# Patient Record
Sex: Male | Born: 1985 | Race: Black or African American | Hispanic: No | Marital: Married | Smoking: Current every day smoker
Health system: Southern US, Community
[De-identification: ages and names within clinical notes are randomized; demographics above are authoritative.]

---

## 2012-10-20 ENCOUNTER — Emergency Department: Payer: Self-pay | Admitting: Emergency Medicine

## 2013-05-22 ENCOUNTER — Emergency Department: Payer: Self-pay | Admitting: Emergency Medicine

## 2013-09-22 ENCOUNTER — Emergency Department: Payer: Self-pay | Admitting: Emergency Medicine

## 2014-01-14 ENCOUNTER — Emergency Department: Payer: Self-pay | Admitting: Emergency Medicine

## 2014-01-18 ENCOUNTER — Emergency Department: Payer: Self-pay | Admitting: Emergency Medicine

## 2014-07-14 ENCOUNTER — Emergency Department
Admission: EM | Admit: 2014-07-14 | Discharge: 2014-07-14 | Disposition: A | Discharge status: Home / Self Care | Payer: Self-pay | Attending: Emergency Medicine | Admitting: Emergency Medicine

## 2014-07-14 ENCOUNTER — Other Ambulatory Visit: Payer: Self-pay

## 2014-07-14 ENCOUNTER — Encounter: Payer: Self-pay | Admitting: Emergency Medicine

## 2014-07-14 DIAGNOSIS — F10129 Alcohol abuse with intoxication, unspecified: Secondary | ICD-10-CM | POA: Insufficient documentation

## 2014-07-14 DIAGNOSIS — Z720 Tobacco use: Secondary | ICD-10-CM | POA: Insufficient documentation

## 2014-07-14 DIAGNOSIS — F10220 Alcohol dependence with intoxication, uncomplicated: Secondary | ICD-10-CM

## 2014-07-14 DIAGNOSIS — R55 Syncope and collapse: Secondary | ICD-10-CM | POA: Insufficient documentation

## 2014-07-14 LAB — URINE DRUG SCREEN, QUALITATIVE (ARMC ONLY)
Amphetamines, Ur Screen: NOT DETECTED
Barbiturates, Ur Screen: NOT DETECTED
Benzodiazepine, Ur Scrn: NOT DETECTED
Cannabinoid 50 Ng, Ur ~~LOC~~: POSITIVE — AB
Cocaine Metabolite,Ur ~~LOC~~: NOT DETECTED
MDMA (Ecstasy)Ur Screen: NOT DETECTED
Methadone Scn, Ur: NOT DETECTED
OPIATE, UR SCREEN: NOT DETECTED
Phencyclidine (PCP) Ur S: NOT DETECTED
Tricyclic, Ur Screen: NOT DETECTED

## 2014-07-14 LAB — ETHANOL: Alcohol, Ethyl (B): 197 mg/dL — ABNORMAL HIGH (ref <5)

## 2014-07-14 MED ORDER — ONDANSETRON HCL 4 MG/2ML IJ SOLN
INTRAMUSCULAR | Status: AC
  Administered 2014-07-14: 4 mg
  Filled 2014-07-14: qty 2

## 2014-07-14 NOTE — ED Provider Notes (Signed)
[facility] Emergency Department Provider Note  ____________________________________________  Time seen: 10:30 PM  I have reviewed the triage vital signs and the nursing notes.   HISTORY  Chief Complaint Loss of Consciousness and Alcohol Intoxication      HPI Roger Garner is a 29 y.o. male presents with near syncopal episode while in the bathroom at home that was witnessed by his fiance. Patient admits to drinking several shots of alcohol today. Patient denies any trauma at all secondary to episode. Patient has no complaints at present. Patient states "I just drank too much.     History reviewed. No pertinent past medical history.  There are no active problems to display for this patient.   History reviewed. No pertinent past surgical history.  No current outpatient prescriptions on file.  Allergies Review of patient's allergies indicates no known allergies.  No family history on file.  Social History History  Substance Use Topics  . Smoking status: Current Every Day Smoker -- 0.50 packs/day    Types: Cigarettes  . Smokeless tobacco: Not on file  . Alcohol Use: 1.8 - 2.4 oz/week    3-4 Shots of liquor per week    Review of Systems  Constitutional: Negative for fever. Eyes: Negative for visual changes. ENT: Negative for sore throat. Cardiovascular: Negative for chest pain. Respiratory: Negative for shortness of breath. Gastrointestinal: Negative for abdominal pain, vomiting and diarrhea. Genitourinary: Negative for dysuria. Musculoskeletal: Negative for back pain. Skin: Negative for rash. Neurological: Negative for headaches, focal weakness or numbness.   10-point ROS otherwise negative.  ____________________________________________   PHYSICAL EXAM:  VITAL SIGNS: ED Triage Vitals  Enc Vitals Group     BP 07/14/14 2057 129/103 mmHg     Pulse Rate 07/14/14 2057 75     Resp 07/14/14 2057 17     Temp --      Temp Source  07/14/14 2057 Oral     SpO2 07/14/14 2057 97 %     Weight 07/14/14 2057 176 lb (79.833 kg)     Height 07/14/14 2057 5\' 9"  (1.753 m)     Head Cir --      Peak Flow --      Pain Score --      Pain Loc --      Pain Edu? --      Excl. in GC? --      Constitutional: Alert and oriented. Well appearing and in no distress. Alcohol on breath Eyes: Conjunctivae are normal. PERRL. Normal extraocular movements. ENT   Head: Normocephalic and atraumatic.   Nose: No congestion/rhinnorhea.   Mouth/Throat: Mucous membranes are moist.   Neck: No stridor. Hematological/Lymphatic/Immunilogical: No cervical lymphadenopathy. Cardiovascular: Normal rate, regular rhythm. Normal and symmetric distal pulses are present in all extremities. No murmurs, rubs, or gallops. Respiratory: Normal respiratory effort without tachypnea nor retractions. Breath sounds are clear and equal bilaterally. No wheezes/rales/rhonchi. Gastrointestinal: Soft and nontender. No distention. There is no CVA tenderness. Genitourinary: deferred Musculoskeletal: Nontender with normal range of motion in all extremities. No joint effusions.  No lower extremity tenderness nor edema. Neurologic:  Normal speech and language. No gross focal neurologic deficits are appreciated. Speech is normal.  Skin:  Skin is warm, dry and intact. No rash noted. Psychiatric: Mood and affect are normal. Speech and behavior are normal. Patient exhibits appropriate insight and judgment.  ____________________________________________    LABS (pertinent positives/negatives)  Labs Reviewed  ETHANOL - Abnormal; Notable for the following:    Alcohol,  Ethyl (B) 197 (*)    All other components within normal limits  URINE DRUG SCREEN, QUALITATIVE ([facility]) - Abnormal; Notable for the following:    Cannabinoid 50 Ng, Ur [place] POSITIVE (*)    All other components within normal limits    ____________________________________________   EKG   Date:  07/14/2014  Rate: 84  Rhythm: normal sinus rhythm  QRS Axis: normal  Intervals: normal  ST/T Wave abnormalities: normal  Conduction Disutrbances: none  Narrative Interpretation: unremarkable      ____________________________________________    ____________________________________________   INITIAL IMPRESSION / ASSESSMENT AND PLAN / ED COURSEE  history and physical exam, lab data including positive alcohol level of 197 in addition to positive marijuana suspect this is likely etiology of patient's syncopal episode patient undergo intoxicated at this time  Pertinent labs & imaging results that were available during my care of the patient were reviewed by me and considered in my medical decision making (see chart for details).    ____________________________________________   FINAL CLINICAL IMPRESSION(S) / ED DIAGNOSES  Final diagnoses:  Alcohol intoxication in episodic drinker, uncomplicated      Darci Currentandolph N Audrena Talaga, MD 07/14/14 [phone]
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
## 2014-07-14 NOTE — Discharge Instructions (Signed)
Alcohol Intoxication  Alcohol intoxication occurs when the amount of alcohol that a person has consumed impairs his or her ability to mentally and physically function. Alcohol directly impairs the normal chemical activity of the brain. Drinking large amounts of alcohol can lead to changes in mental function and behavior, and it can cause many physical effects that can be harmful.   Alcohol intoxication can range in severity from mild to very severe. Various factors can affect the level of intoxication that occurs, such as the person's age, gender, weight, frequency of alcohol consumption, and the presence of other medical conditions (such as diabetes, seizures, or heart conditions). Dangerous levels of alcohol intoxication may occur when people drink large amounts of alcohol in a short period (binge drinking). Alcohol can also be especially dangerous when combined with certain prescription medicines or "recreational" drugs.  SIGNS AND SYMPTOMS  Some common signs and symptoms of mild alcohol intoxication include:  · Loss of coordination.  · Changes in mood and behavior.  · Impaired judgment.  · Slurred speech.  As alcohol intoxication progresses to more severe levels, other signs and symptoms will appear. These may include:  · Vomiting.  · Confusion and impaired memory.  · Slowed breathing.  · Seizures.  · Loss of consciousness.  DIAGNOSIS   Your health care provider will take a medical history and perform a physical exam. You will be asked about the amount and type of alcohol you have consumed. Blood tests will be done to measure the concentration of alcohol in your blood. In many places, your blood alcohol level must be lower than 80 mg/dL (0.08%) to legally drive. However, many dangerous effects of alcohol can occur at much lower levels.   TREATMENT   People with alcohol intoxication often do not require treatment. Most of the effects of alcohol intoxication are temporary, and they go away as the alcohol naturally  leaves the body. Your health care provider will monitor your condition until you are stable enough to go home. Fluids are sometimes given through an IV access tube to help prevent dehydration.   HOME CARE INSTRUCTIONS  · Do not drive after drinking alcohol.  · Stay hydrated. Drink enough water and fluids to keep your urine clear or pale yellow. Avoid caffeine.    · Only take over-the-counter or prescription medicines as directed by your health care provider.    SEEK MEDICAL CARE IF:   · You have persistent vomiting.    · You do not feel better after a few days.  · You have frequent alcohol intoxication. Your health care provider can help determine if you should see a substance use treatment counselor.  SEEK IMMEDIATE MEDICAL CARE IF:   · You become shaky or tremble when you try to stop drinking.    · You shake uncontrollably (seizure).    · You throw up (vomit) blood. This may be bright red or may look like black coffee grounds.    · You have blood in your stool. This may be bright red or may appear as a black, tarry, bad smelling stool.    · You become lightheaded or faint.    MAKE SURE YOU:   · Understand these instructions.  · Will watch your condition.  · Will get help right away if you are not doing well or get worse.  Document Released: 12/03/2004 Document Revised: 10/26/2012 Document Reviewed: 07/29/2012  Exit[employer]® Patient Information ©2015 [employer]. This information is not intended to replace advice given to you by your health care provider. Make sure   you discuss any questions you have with your health care provider.

## 2015-06-29 ENCOUNTER — Encounter (HOSPITAL_COMMUNITY): Payer: Self-pay | Admitting: *Deleted

## 2015-06-29 ENCOUNTER — Emergency Department (HOSPITAL_COMMUNITY)
Admission: EM | Admit: 2015-06-29 | Discharge: 2015-06-29 | Disposition: A | Discharge status: Home / Self Care | Payer: Self-pay | Attending: Emergency Medicine | Admitting: Emergency Medicine

## 2015-06-29 DIAGNOSIS — S025XXA Fracture of tooth (traumatic), initial encounter for closed fracture: Secondary | ICD-10-CM | POA: Insufficient documentation

## 2015-06-29 DIAGNOSIS — S0591XA Unspecified injury of right eye and orbit, initial encounter: Secondary | ICD-10-CM | POA: Insufficient documentation

## 2015-06-29 DIAGNOSIS — Y998 Other external cause status: Secondary | ICD-10-CM | POA: Insufficient documentation

## 2015-06-29 DIAGNOSIS — Y9289 Other specified places as the place of occurrence of the external cause: Secondary | ICD-10-CM | POA: Insufficient documentation

## 2015-06-29 DIAGNOSIS — K0889 Other specified disorders of teeth and supporting structures: Secondary | ICD-10-CM

## 2015-06-29 DIAGNOSIS — F17210 Nicotine dependence, cigarettes, uncomplicated: Secondary | ICD-10-CM | POA: Insufficient documentation

## 2015-06-29 DIAGNOSIS — Y9389 Activity, other specified: Secondary | ICD-10-CM | POA: Insufficient documentation

## 2015-06-29 MED ORDER — AMOXICILLIN 500 MG PO CAPS: 500.0000 mg | ORAL_CAPSULE | Freq: Two times a day (BID) | ORAL | Status: AC

## 2015-06-29 NOTE — ED Notes (Signed)
Pt departed in NAD.  
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
## 2015-08-03 ENCOUNTER — Emergency Department: Payer: Self-pay

## 2015-08-03 ENCOUNTER — Encounter: Payer: Self-pay | Admitting: Emergency Medicine

## 2015-08-03 ENCOUNTER — Emergency Department
Admission: EM | Admit: 2015-08-03 | Discharge: 2015-08-03 | Disposition: A | Discharge status: Home / Self Care | Payer: Self-pay | Attending: Emergency Medicine | Admitting: Emergency Medicine

## 2015-08-03 DIAGNOSIS — Z23 Encounter for immunization: Secondary | ICD-10-CM | POA: Insufficient documentation

## 2015-08-03 DIAGNOSIS — W25XXXA Contact with sharp glass, initial encounter: Secondary | ICD-10-CM | POA: Insufficient documentation

## 2015-08-03 DIAGNOSIS — Y999 Unspecified external cause status: Secondary | ICD-10-CM | POA: Insufficient documentation

## 2015-08-03 DIAGNOSIS — Y939 Activity, unspecified: Secondary | ICD-10-CM | POA: Insufficient documentation

## 2015-08-03 DIAGNOSIS — T148XXA Other injury of unspecified body region, initial encounter: Secondary | ICD-10-CM

## 2015-08-03 DIAGNOSIS — Y929 Unspecified place or not applicable: Secondary | ICD-10-CM | POA: Insufficient documentation

## 2015-08-03 DIAGNOSIS — F17210 Nicotine dependence, cigarettes, uncomplicated: Secondary | ICD-10-CM | POA: Insufficient documentation

## 2015-08-03 DIAGNOSIS — S60412A Abrasion of right middle finger, initial encounter: Secondary | ICD-10-CM | POA: Insufficient documentation

## 2015-08-03 DIAGNOSIS — S60419A Abrasion of unspecified finger, initial encounter: Secondary | ICD-10-CM

## 2015-08-03 MED ORDER — TETANUS-DIPHTH-ACELL PERTUSSIS 5-2.5-18.5 LF-MCG/0.5 IM SUSP
0.5000 mL | Freq: Once | INTRAMUSCULAR | Status: AC
  Administered 2015-08-03: 0.5 mL via INTRAMUSCULAR
  Filled 2015-08-03: qty 0.5
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
## 2015-08-03 NOTE — Discharge Instructions (Signed)
Abrasion °An abrasion is a cut or scrape on the outer surface of your skin. An abrasion does not extend through all of the layers of your skin. It is important to care for your abrasion properly to prevent infection. °CAUSES °Most abrasions are caused by falling on or gliding across the ground or another surface. When your skin rubs on something, the outer and inner layer of skin rubs off.  °SYMPTOMS °A cut or scrape is the main symptom of this condition. The scrape may be bleeding, or it may appear red or pink. If there was an associated fall, there may be an underlying bruise. °DIAGNOSIS °An abrasion is diagnosed with a physical exam. °TREATMENT °Treatment for this condition depends on how large and deep the abrasion is. Usually, your abrasion will be cleaned with water and mild soap. This removes any dirt or debris that may be stuck. An antibiotic ointment may be applied to the abrasion to help prevent infection. A bandage (dressing) may be placed on the abrasion to keep it clean. °You may also need a tetanus shot. °HOME CARE INSTRUCTIONS °Medicines °· Take or apply medicines only as directed by your health care provider. °· If you were prescribed an antibiotic ointment, finish all of it even if you start to feel better. °Wound Care °· Clean the wound with mild soap and water 2-3 times per day or as directed by your health care provider. Pat your wound dry with a clean towel. Do not rub it. °· There are many different ways to close and cover a wound. Follow instructions from your health care provider about: °· Wound care. °· Dressing changes and removal. °· Check your wound every day for signs of infection. Watch for: °· Redness, swelling, or pain. °· Fluid, blood, or pus. °General Instructions °· Keep the dressing dry as directed by your health care provider. Do not take baths, swim, use a hot tub, or do anything that would put your wound underwater until your health care provider approves. °· If there is  swelling, raise (elevate) the injured area above the level of your heart while you are sitting or lying down. °· Keep all follow-up visits as directed by your health care provider. This is important. °SEEK MEDICAL CARE IF: °· You received a tetanus shot and you have swelling, severe pain, redness, or bleeding at the injection site. °· Your pain is not controlled with medicine. °· You have increased redness, swelling, or pain at the site of your wound. °SEEK IMMEDIATE MEDICAL CARE IF: °· You have a red streak going away from your wound. °· You have a fever. °· You have fluid, blood, or pus coming from your wound. °· You notice a bad smell coming from your wound or your dressing. °  °This information is not intended to replace advice given to you by your health care provider. Make sure you discuss any questions you have with your health care provider. °  °Document Released: 12/03/2004 Document Revised: 11/14/2014 Document Reviewed: 02/21/2014 °Elsevier Interactive Patient Education ©2016 [employer] ° °Wound Care °Taking care of your wound properly can help to prevent pain and infection. It can also help your wound to heal more quickly.  °HOW TO CARE FOR YOUR WOUND  °· Take or apply over-the-counter and prescription medicines only as told by your health care provider. °· If you were prescribed antibiotic medicine, take or apply it as told by your health care provider. Do not stop using the antibiotic even if your condition improves. °·   Clean the wound each day or as told by your health care provider.  Wash the wound with mild soap and water.  Rinse the wound with water to remove all soap.  Pat the wound dry with a clean towel. Do not rub it.  There are many different ways to close and cover a wound. For example, a wound can be covered with stitches (sutures), skin glue, or adhesive strips. Follow instructions from your health care provider about:  How to take care of your wound.  When and how you should  change your bandage (dressing).  When you should remove your dressing.  Removing whatever was used to close your wound.  Check your wound every day for signs of infection. Watch for:  Redness, swelling, or pain.  Fluid, blood, or pus.  Keep the dressing dry until your health care provider says it can be removed. Do not take baths, swim, use a hot tub, or do anything that would put your wound underwater until your health care provider approves.  Raise (elevate) the injured area above the level of your heart while you are sitting or lying down.  Do not scratch or pick at the wound.  Keep all follow-up visits as told by your health care provider. This is important. SEEK MEDICAL CARE IF:  You received a tetanus shot and you have swelling, severe pain, redness, or bleeding at the injection site.  You have a fever.  Your pain is not controlled with medicine.  You have increased redness, swelling, or pain at the site of your wound.  You have fluid, blood, or pus coming from your wound.  You notice a bad smell coming from your wound or your dressing. SEEK IMMEDIATE MEDICAL CARE IF:  You have a red streak going away from your wound.   This information is not intended to replace advice given to you by your health care provider. Make sure you discuss any questions you have with your health care provider.   Document Released: 12/03/2007 Document Revised: 07/10/2014 Document Reviewed: 02/19/2014 Elsevier Interactive Patient Education 2016 [employer]   Please change Band-Aids daily. Apply Neosporin to the skin abrasions daily.

## 2015-08-03 NOTE — ED Notes (Signed)
Pt to ed with c/o laceration to third digit right hand.  Pt states he cut it on glass earlier today.

## 2016-09-04 IMAGING — DX DG FINGER MIDDLE 2+V*R*
3 series · 3 of 3 positions shown · non-contrast
Comparison: None.

CLINICAL DATA: Left middle finger laceration, cut with glass.

EXAM:
RIGHT MIDDLE FINGER 2+V

[finger ap]
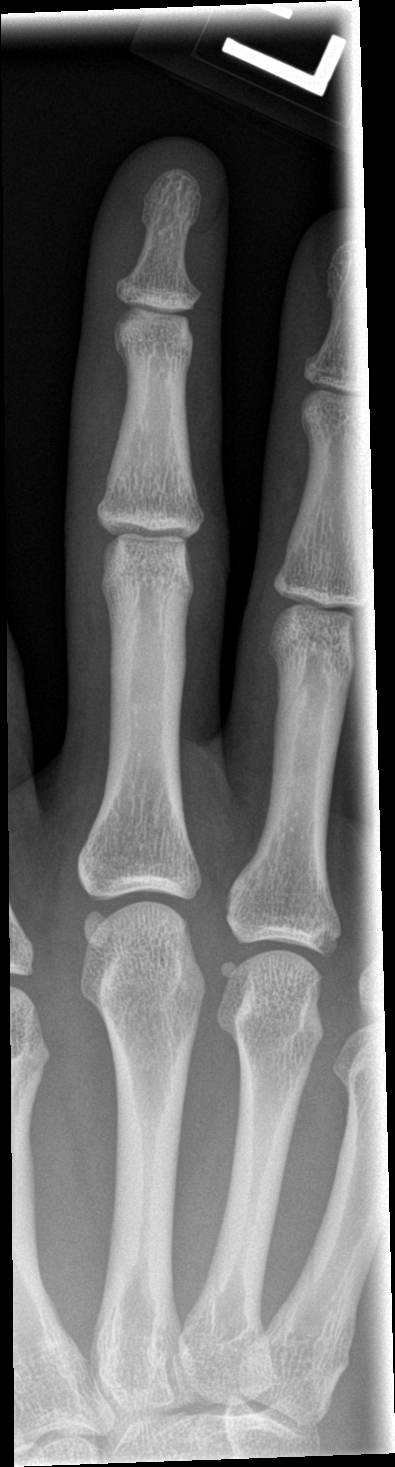

[finger obl]
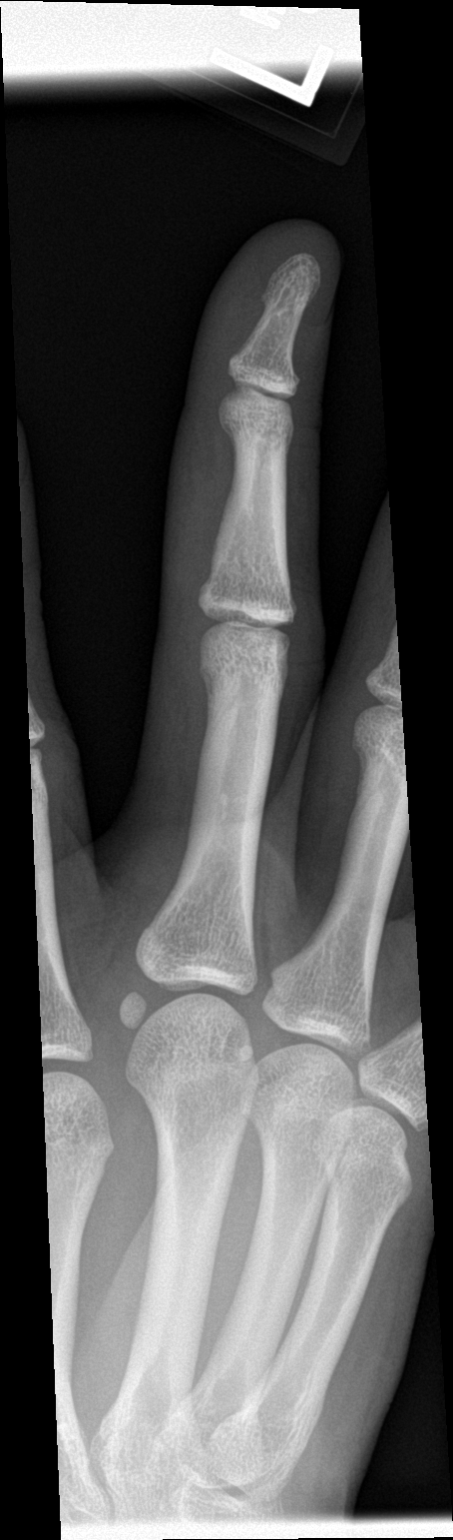

[finger lat]
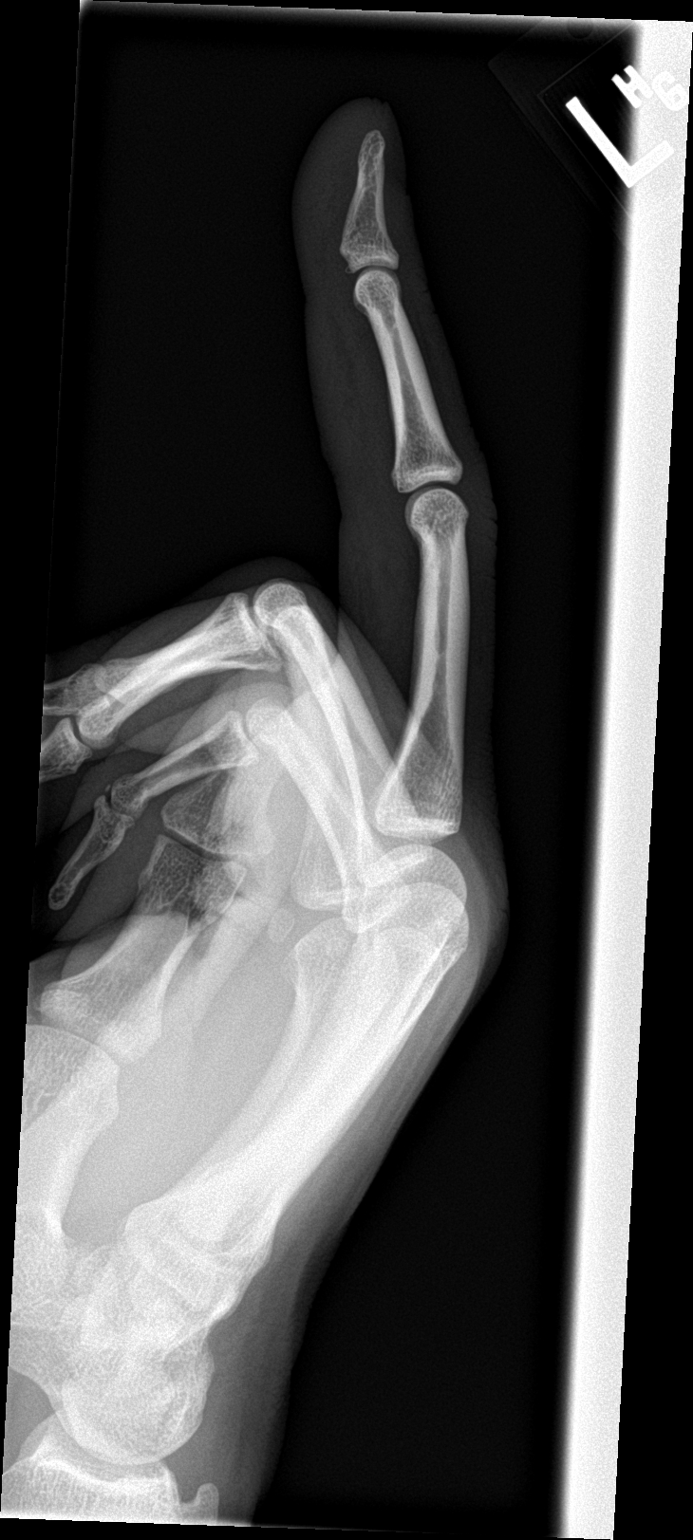

[3 of 3 positions shown; findings below may reference images not displayed]

FINDINGS: Mild distal soft tissue swelling. No fracture, dislocation or
radiopaque foreign body.
IMPRESSION: No fracture or radiopaque foreign body.

## 2017-02-25 ENCOUNTER — Emergency Department
Admission: EM | Admit: 2017-02-25 | Discharge: 2017-02-25 | Disposition: A | Discharge status: Home / Self Care | Payer: Self-pay | Attending: Emergency Medicine | Admitting: Emergency Medicine

## 2017-02-25 ENCOUNTER — Encounter: Payer: Self-pay | Admitting: Emergency Medicine

## 2017-02-25 ENCOUNTER — Other Ambulatory Visit: Payer: Self-pay

## 2017-02-25 ENCOUNTER — Emergency Department: Payer: Self-pay

## 2017-02-25 DIAGNOSIS — R05 Cough: Secondary | ICD-10-CM | POA: Insufficient documentation

## 2017-02-25 DIAGNOSIS — J069 Acute upper respiratory infection, unspecified: Secondary | ICD-10-CM | POA: Insufficient documentation

## 2017-02-25 DIAGNOSIS — R059 Cough, unspecified: Secondary | ICD-10-CM

## 2017-02-25 DIAGNOSIS — F17210 Nicotine dependence, cigarettes, uncomplicated: Secondary | ICD-10-CM | POA: Insufficient documentation

## 2017-02-25 LAB — INFLUENZA PANEL BY PCR (TYPE A & B)
INFLBPCR: NEGATIVE
Influenza A By PCR: NEGATIVE

## 2017-02-25 MED ORDER — BENZONATATE 100 MG PO CAPS: 100.0000 mg | ORAL_CAPSULE | Freq: Four times a day (QID) | ORAL | 0 refills | Status: DC | PRN

## 2017-02-25 MED ORDER — IBUPROFEN 800 MG PO TABS: 800.0000 mg | ORAL_TABLET | Freq: Three times a day (TID) | ORAL | 0 refills | Status: AC | PRN

## 2017-02-25 MED ORDER — BENZONATATE 100 MG PO CAPS
100.0000 mg | ORAL_CAPSULE | Freq: Once | ORAL | Status: AC
  Administered 2017-02-25: 100 mg via ORAL
  Filled 2017-02-25: qty 1

## 2017-02-25 MED ORDER — BENZONATATE 100 MG PO CAPS: 100.0000 mg | ORAL_CAPSULE | Freq: Four times a day (QID) | ORAL | 0 refills | Status: AC | PRN

## 2017-02-25 MED ORDER — OXYMETAZOLINE HCL 0.05 % NA SOLN
1.0000 | Freq: Once | NASAL | Status: AC
  Administered 2017-02-25: 1 via NASAL
  Filled 2017-02-25: qty 15

## 2017-02-25 NOTE — Discharge Instructions (Signed)
Please follow up with your primary care physician or the acute clinic.

## 2017-02-25 NOTE — ED Notes (Signed)
Pt has cough, congestion and intermittent fever since last week.  States coughing up yellow phlegm  cig smoker.  Pt alert.

## 2017-02-25 NOTE — ED Provider Notes (Signed)
[facility] Emergency Department Provider Note   ____________________________________________   First MD Initiated Contact with Patient 02/25/17 0040     (approximate)  I have reviewed the triage vital signs and the nursing notes.   HISTORY  Chief Complaint Cough and Nasal Congestion   HPI Roger Garner is a 31 y.o. male who comes into the hospital today stating that he has been having chills cough body aches fevers.  He reports that about 8 days ago he started having chills and body aches.  He started having a cough about 2-3 days ago and reports this been getting worse.  He has had fevers up to 101 he states yesterday and today.  He reports that he could not really eat much and thought he had the flu.  He is been also taking TheraFlu for [place] and NyQuil but he has not gotten any better.  His cough has been productive of yellow sputum.  He denies any sick contacts.  He has a stuffy nose and has had some intermittent wheezing.  The patient rates his pain a 6 out of 10 in intensity.  He decided to come in for evaluation of his symptoms.  History reviewed. No pertinent past medical history.  There are no active problems to display for this patient.   History reviewed. No pertinent surgical history.  Prior to Admission medications   Medication Sig Start Date End Date Taking? Authorizing Provider  amoxicillin (AMOXIL) 500 MG capsule Take 1 capsule (500 mg total) by mouth 2 (two) times daily. 06/29/15   Audry PiliMohr, Tyler, PA-C  benzonatate (TESSALON PERLES) 100 MG capsule Take 1 capsule (100 mg total) by mouth every 6 (six) hours as needed for cough. 02/25/17   Rebecka ApleyWebster, Shaheen Mende P, MD  ibuprofen (ADVIL,MOTRIN) 800 MG tablet Take 1 tablet (800 mg total) by mouth every 8 (eight) hours as needed. 02/25/17   Rebecka ApleyWebster, Oryan Winterton P, MD    Allergies Patient has no known allergies.  No family history on file.  Social History Social History   Tobacco Use  . Smoking  status: Current Every Day Smoker    Packs/day: 0.50    Types: Cigarettes  . Smokeless tobacco: Never Used  Substance Use Topics  . Alcohol use: Yes    Alcohol/week: 1.8 - 2.4 oz    Types: 3 - 4 Shots of liquor per week  . Drug use: No    Review of Systems  Constitutional:  fever/chills Eyes: No visual changes. ENT: Nasal congestion Cardiovascular: Denies chest pain. Respiratory: Cough Gastrointestinal: No abdominal pain.  No nausea, no vomiting.  No diarrhea.  No constipation. Genitourinary: Negative for dysuria. Musculoskeletal: Negative for back pain. Skin: Negative for rash. Neurological: Negative for headaches, focal weakness or numbness.   ____________________________________________   PHYSICAL EXAM:  VITAL SIGNS: ED Triage Vitals  Enc Vitals Group     BP 02/25/17 0014 126/71     Pulse Rate 02/25/17 0014 92     Resp 02/25/17 0014 20     Temp 02/25/17 0014 98.9 F (37.2 C)     Temp Source 02/25/17 0014 Oral     SpO2 02/25/17 0014 98 %     Weight 02/25/17 0012 196 lb (88.9 kg)     Height 02/25/17 0012 5\' 9"  (1.753 m)     Head Circumference --      Peak Flow --      Pain Score --      Pain Loc --  Pain Edu? --      Excl. in GC? --     Constitutional: Alert and oriented. Well appearing and in no acute distress. Eyes: Conjunctivae are normal. PERRL. EOMI. Head: Atraumatic. Nose:  congestion/rhinnorhea. Mouth/Throat: Mucous membranes are moist.  Oropharynx non-erythematous. Cardiovascular: Normal rate, regular rhythm. Grossly normal heart sounds.  Good peripheral circulation. Respiratory: Normal respiratory effort.  No retractions. Lungs CTAB. Gastrointestinal: Soft and nontender. No distention.  Positive bowel sounds Musculoskeletal: No lower extremity tenderness nor edema.   Neurologic:  Normal speech and language.  Skin:  Skin is warm, dry and intact.  Psychiatric: Mood and affect are normal.   ____________________________________________    LABS (all labs ordered are listed, but only abnormal results are displayed)  Labs Reviewed  INFLUENZA PANEL BY PCR (TYPE A & B)   ____________________________________________  EKG  none ____________________________________________  RADIOLOGY  Dg Chest 2 View  Result Date: 02/25/2017 CLINICAL DATA:  Fever and cough EXAM: CHEST  2 VIEW COMPARISON:  None. FINDINGS: Lungs are clear. Heart size and pulmonary vascularity are normal. No adenopathy. No pneumothorax. No bone lesions. IMPRESSION: No edema or consolidation. Electronically Signed   By: Bretta BangWilliam  Woodruff III M.D.   On: 02/25/2017 01:11    ____________________________________________   PROCEDURES  Procedure(s) performed: None  Procedures  Critical Care performed: No  ____________________________________________   INITIAL IMPRESSION / ASSESSMENT AND PLAN / ED COURSE  As part of my medical decision making, I reviewed the following data within the electronic MEDICAL RECORD NUMBER Notes from prior ED visits and [place] Controlled Substance Database   This is a 31 year old male who comes into the hospital today with some chills and body aches and fever.  The patient is concerned that he may have the flu.  My differential diagnosis includes pneumonia, influenza, viral upper respiratory infection  I sent the patient for chest x-ray as well as a flu swab.  The patient did receive a dose of Tessalon and Afrin.  The patient's flu test is negative and he does not have pneumonia.  I feel that the patient has a viral upper respiratory infection.  He will be discharged home and encouraged to do symptomatic treatment.  He will be discharged home.      ____________________________________________   FINAL CLINICAL IMPRESSION(S) / ED DIAGNOSES  Final diagnoses:  Viral upper respiratory tract infection  Cough     ED Discharge Orders        Ordered    benzonatate (TESSALON PERLES) 100 MG capsule  Every 6 hours PRN,   Status:   Discontinued     02/25/17 0215    ibuprofen (ADVIL,MOTRIN) 800 MG tablet  Every 8 hours PRN     02/25/17 0215    benzonatate (TESSALON PERLES) 100 MG capsule  Every 6 hours PRN     02/25/17 0215       Note:  This document was prepared using Dragon voice recognition software and may include unintentional dictation errors.    Rebecka ApleyWebster, Hershey Knauer P, MD 02/25/17 [phone]

## 2017-02-25 NOTE — ED Triage Notes (Signed)
Patient ambulatory to triage with steady gait, without difficulty or distress noted, mask in place; pt reports x 2 days having fever, prod cough yellow sputum; temp 99.5-100.5 at home; taking OTC meds without relief
